# Patient Record
Sex: Male | Born: 1985 | Race: Black or African American | Hispanic: No | Marital: Married | Smoking: Never smoker
Health system: Southern US, Community
[De-identification: ages and names within clinical notes are randomized; demographics above are authoritative.]

## PROBLEM LIST (undated history)

## (undated) DIAGNOSIS — J45909 Unspecified asthma, uncomplicated: Secondary | ICD-10-CM

---

## 2018-03-10 ENCOUNTER — Ambulatory Visit
Admission: RE | Admit: 2018-03-10 | Discharge: 2018-03-10 | Disposition: A | Discharge status: Home / Self Care | Payer: No Typology Code available for payment source | Source: Ambulatory Visit | Attending: Nurse Practitioner | Admitting: Nurse Practitioner

## 2018-03-10 ENCOUNTER — Other Ambulatory Visit: Payer: Self-pay | Admitting: Nurse Practitioner

## 2018-03-10 DIAGNOSIS — M25562 Pain in left knee: Secondary | ICD-10-CM

## 2018-03-10 DIAGNOSIS — M542 Cervicalgia: Secondary | ICD-10-CM

## 2018-03-10 DIAGNOSIS — M25512 Pain in left shoulder: Secondary | ICD-10-CM

## 2018-03-11 DIAGNOSIS — S46012A Strain of muscle(s) and tendon(s) of the rotator cuff of left shoulder, initial encounter: Secondary | ICD-10-CM | POA: Insufficient documentation

## 2019-01-20 ENCOUNTER — Emergency Department (HOSPITAL_COMMUNITY)
Admission: EM | Admit: 2019-01-20 | Discharge: 2019-01-20 | Disposition: A | Discharge status: Home / Self Care | Payer: 59 | Attending: Emergency Medicine | Admitting: Emergency Medicine

## 2019-01-20 ENCOUNTER — Other Ambulatory Visit: Payer: Self-pay

## 2019-01-20 ENCOUNTER — Encounter (HOSPITAL_COMMUNITY): Payer: Self-pay | Admitting: Emergency Medicine

## 2019-01-20 DIAGNOSIS — M7918 Myalgia, other site: Secondary | ICD-10-CM | POA: Diagnosis not present

## 2019-01-20 DIAGNOSIS — M542 Cervicalgia: Secondary | ICD-10-CM | POA: Insufficient documentation

## 2019-01-20 DIAGNOSIS — R519 Headache, unspecified: Secondary | ICD-10-CM

## 2019-01-20 DIAGNOSIS — R51 Headache: Secondary | ICD-10-CM | POA: Diagnosis present

## 2019-01-20 DIAGNOSIS — U071 COVID-19: Secondary | ICD-10-CM | POA: Diagnosis not present

## 2019-01-20 DIAGNOSIS — W57XXXA Bitten or stung by nonvenomous insect and other nonvenomous arthropods, initial encounter: Secondary | ICD-10-CM

## 2019-01-20 DIAGNOSIS — R509 Fever, unspecified: Secondary | ICD-10-CM | POA: Diagnosis not present

## 2019-01-20 HISTORY — DX: Unspecified asthma, uncomplicated: J45.909

## 2019-01-20 MED ORDER — DIPHENHYDRAMINE HCL 25 MG PO CAPS
25.0000 mg | ORAL_CAPSULE | Freq: Once | ORAL | Status: AC
  Administered 2019-01-20: 25 mg via ORAL
  Filled 2019-01-20: qty 1

## 2019-01-20 MED ORDER — ACETAMINOPHEN 500 MG PO TABS
1000.0000 mg | ORAL_TABLET | Freq: Once | ORAL | Status: AC
  Administered 2019-01-20: 09:00:00 1000 mg via ORAL
  Filled 2019-01-20: qty 2

## 2019-01-20 MED ORDER — PROCHLORPERAZINE MALEATE 10 MG PO TABS: 10.0000 mg | ORAL_TABLET | Freq: Two times a day (BID) | ORAL | 0 refills | Status: AC | PRN

## 2019-01-20 MED ORDER — DOXYCYCLINE HYCLATE 100 MG PO CAPS: 100.0000 mg | ORAL_CAPSULE | Freq: Two times a day (BID) | ORAL | 0 refills | Status: AC

## 2019-01-20 MED ORDER — PROCHLORPERAZINE MALEATE 5 MG PO TABS
10.0000 mg | ORAL_TABLET | Freq: Once | ORAL | Status: AC
  Administered 2019-01-20: 10 mg via ORAL
  Filled 2019-01-20: qty 2

## 2019-01-20 NOTE — Discharge Instructions (Addendum)
Please see the information and instructions below regarding your visit.  Your diagnoses today include:  1. Frontal headache   2. Tick bite, initial encounter     You were seen and treated in the emergency department today for headache. Fortunately, your vitals, exam, and work-up is reassuring with no apparent emergent cause for your headache at this time.  Tests performed today include: See side panel of your discharge paperwork for testing performed today. Vital signs are listed at the bottom of these instructions.   Your COVID-19 test will be available in 24 to 48 hours.  It is available in my chart and if it is positive you will receive a call.  Medications prescribed:    Try to avoid daily or regular use of tylenol, aspirin, ibuprofen, and other overt-the-counter pain medications as this can contribute to rebound headaches.   Take any prescribed medications only as prescribed, and any over the counter medications only as directed on the packaging.  Doxycycline is an antibiotic that fights infection of tick origin. This medication can make your skin sensitive to the sun, so please ensure that you wear sunscreen, hats, or other coverage over your skin while taking this. This medicine CANNOT be taken by women while pregnant, breastfeeding, or trying to become pregnant.  Please speak with a healthcare provider if any of these situations apply to you.  You prescribed Compazine.  This is a nausea medication.  It works well for headache.  Please take it with Benadryl, 25 mg capsule to reduce side effects.  It may be taken up to twice a day.  You are prescribed ibuprofen, a non-steroidal anti-inflammatory agent (NSAID) for pain. You may take 600 mg every 6 hours as needed for pain. If still requiring this medication around the clock for acute pain after 10 days, please see your primary healthcare provider.  Women who are pregnant, breastfeeding, or planning on becoming pregnant should not take  non-steroidal anti-inflammatories such as Advil and Aleve. Tylenol is a safe over the counter pain reliever in pregnant women.  You may combine this medication with Tylenol, 650 mg every 6 hours, so you are receiving something for pain every 3 hours.  This is not a long-term medication unless under the care and direction of your primary provider. Taking this medication long-term and not under the supervision of a healthcare provider could increase the risk of stomach ulcers, kidney problems, and cardiovascular problems such as high blood pressure.    Home care instructions:   Drink plenty of fluids at home. This will help with your headache. Be cautious with caffeine use, as this can cause your headache to rebound when the effects wear off. If you drink more than 2 cups of coffee/caffeinated tea, or caffeinated soda per day, I suggest you wean down that amount.  Please follow any educational materials contained in this packet.   Follow-up instructions: Please follow-up with your primary care provider as needed for further evaluation of your symptoms if they are not completely improved.   Return instructions:  Please return to the Emergency Department if you experience worsening symptoms. It is VERY important that you monitor your symptoms at home. If you develop worsening headache, new fever, new neck stiffness, rash, focal weakness or numbness, or any other new or concerning symptoms, please return to the ED immediately, as these may be signs that your headache has become a potentially serious and life-threatening condition.  Please return if you have any other emergent concerns.  Additional  Information: =  Your vital signs today were: BP (!) 143/86 (BP Location: Right Arm)    Pulse 87    Temp 99.7 F (37.6 C) (Oral)    Resp 18    Ht 6\' 1"  (1.854 m)    Wt 90.7 kg    SpO2 100%    BMI 26.39 kg/m  If your blood pressure (BP) was elevated on multiple readings during this visit above 130 for  the top number or above 80 for the bottom number, please have this repeated by your primary care provider within one month. --------------  Thank you for allowing us to participate in your care today.

## 2019-01-20 NOTE — ED Provider Notes (Signed)
[name] Provider Note   CSN: [id number] Arrival date & time: 01/20/19  0805     History   Chief Complaint Chief Complaint  Patient presents with  . Tick Bite    HPI Mario LeveeWillie Karl Payer Jr. is a 33 y.o. male.     HPI   Patient is a 33 year old male with a past medical history of asthma presenting for headache, myalgias, fever and concern about a tick bite.  He reports that he began having a frontal headache that gradually evolved over the course of a day proximately 6 days ago.  He also developed some neck stiffness without rigidity.  He had some generalized myalgias but no arthralgias or joint effusions.  He denies any rash.  He denies any visual disturbance, numbness or weakness, speech disturbance, dizziness, lightheadedness or somnolence.  Patient reports that 5 days ago he developed a fever of T-max 100.5.  This has since resolved.  He notes that a couple days before the onset of his symptoms he brushed a tick off of his skin.  It did not in bed.  He searched his skin for other signs of tick bite and did not find any.  Patient denies any history of immune compromise status.  He has been taking ibuprofen intermittently for symptoms.  Patient reports that he was tested 2 days ago for COVID however the results will not be back until next week.  Past Medical History:  Diagnosis Date  . Asthma     There are no active problems to display for this patient.   History reviewed. No pertinent surgical history.      Home Medications    Prior to Admission medications   Not on File    Family History History reviewed. No pertinent family history.  Social History Social History   Tobacco Use  . Smoking status: Never Smoker  . Smokeless tobacco: Never Used  Substance Use Topics  . Alcohol use: Not on file  . Drug use: Not on file     Allergies   Patient has no known allergies.   Review of Systems Review of Systems   Constitutional: Positive for chills and fever.  HENT: Negative for congestion, sore throat, trouble swallowing and voice change.   Respiratory: Negative for cough, chest tightness and shortness of breath.   Gastrointestinal: Negative for abdominal pain, nausea and vomiting.  Musculoskeletal: Positive for myalgias and neck pain. Negative for arthralgias, joint swelling and neck stiffness.  Neurological: Positive for headaches.     Physical Exam Updated Vital Signs BP (!) 143/86 (BP Location: Right Arm)   Pulse 87   Temp 99.7 F (37.6 C) (Oral)   Resp 18   Ht 6\' 1"  (1.854 m)   Wt 90.7 kg   SpO2 100%   BMI 26.39 kg/m   Physical Exam Vitals signs and nursing note reviewed.  Constitutional:      General: He is not in acute distress.    Appearance: He is well-developed.  HENT:     Head: Normocephalic and atraumatic.  Eyes:     Conjunctiva/sclera: Conjunctivae normal.     Pupils: Pupils are equal, round, and reactive to light.  Neck:     Musculoskeletal: Normal range of motion and neck supple. No neck rigidity.     Comments: Negative Brudzinski sign.  No meningismus. Cardiovascular:     Rate and Rhythm: Normal rate and regular rhythm.     Heart sounds: S1 normal and S2 normal. No  murmur.  Pulmonary:     Effort: Pulmonary effort is normal.     Breath sounds: Normal breath sounds. No wheezing or rales.  Abdominal:     General: There is no distension.     Palpations: Abdomen is soft.     Tenderness: There is no abdominal tenderness. There is no guarding.  Musculoskeletal: Normal range of motion.        General: No deformity.  Lymphadenopathy:     Cervical: No cervical adenopathy.  Skin:    General: Skin is warm and dry.     Findings: No erythema or rash.  Neurological:     General: No focal deficit present.     Mental Status: He is alert and oriented to person, place, and time.     Comments: Mental Status:  Alert, oriented, thought content appropriate, able to give a  coherent history. Speech fluent without evidence of aphasia. Able to follow 2 step commands without difficulty.  Cranial Nerves:  II:  Peripheral visual fields grossly normal, pupils equal, round, reactive to light III,IV, VI: ptosis not present, extra-ocular motions intact bilaterally  V,VII: smile symmetric, facial light touch sensation equal VIII: hearing grossly normal to voice  X: uvula elevates symmetrically  XI: bilateral shoulder shrug symmetric and strong XII: midline tongue extension without fassiculations Motor:  Normal tone. 5/5 in upper and lower extremities bilaterally including strong and equal grip strength and dorsiflexion/plantar flexion Strength 5 out of 5 in upper and lower extremities. Patient moves extremities symmetrically and with good coordination. Normal and symmetric gait.  Psychiatric:        Behavior: Behavior normal.        Thought Content: Thought content normal.        Judgment: Judgment normal.      ED Treatments / Results  Labs (all labs ordered are listed, but only abnormal results are displayed) Labs Reviewed  NOVEL CORONAVIRUS, NAA (HOSPITAL ORDER, SEND-OUT TO REF LAB)    EKG    Radiology No results found.  Procedures Procedures (including critical care time)  Medications Ordered in ED Medications  acetaminophen (TYLENOL) tablet 1,000 mg (1,000 mg Oral Given 01/20/19 0913)  prochlorperazine (COMPAZINE) tablet 10 mg (10 mg Oral Given 01/20/19 0913)  diphenhydrAMINE (BENADRYL) capsule 25 mg (25 mg Oral Given 01/20/19 0914)     Initial Impression / Assessment and Plan / ED Course  I have reviewed the triage vital signs and the nursing notes.  Pertinent labs & imaging results that were available during my care of the patient were reviewed by me and considered in my medical decision making (see chart for details).        This is a well-appearing 33 year old male presenting for headache for myalgias, low-grade fever, now resolved, and  possible tick bite.  Differential diagnosis includes COVID-19, other viral illness, viral meningitis, [place] spotted fever.  Patient is very well-appearing and has normal vital signs here in the emergency department.  He is neurologically intact.  He appears to be improving in his symptoms.  Given lack of meningismus, normal vital signs and well appearance, do not feel that lumbar puncture is indicated.  Engaged in shared decision-making with the patient regarding proceeding with empiric treatment for possible early [facility] given the possible tick bite and concern that there could have been other tics.  This was discussed with Dr. Melene Planan Floyd who is in agreement with plan of care.  Patient elects to be retested for COVID-19 and take doxycycline.  Risks and benefits of treatment were discussed with the patient.  He is given return precautions for any worsening headache, neck stiffness, rash, return of fevers, dizziness, lightheadedness, speech disturbance or somnolence.  Patient is in understanding and agrees with plan of care.  Final Clinical Impressions(s) / ED Diagnoses   Final diagnoses:  Frontal headache  Tick bite, initial encounter    ED Discharge Orders         Ordered    doxycycline (VIBRAMYCIN) 100 MG capsule  2 times daily     01/20/19 1012    prochlorperazine (COMPAZINE) 10 MG tablet  2 times daily PRN     01/20/19 1012           Albesa Seen, PA-C 01/20/19 [place], [place], DO 01/20/19 1401

## 2019-01-20 NOTE — ED Notes (Signed)
Patient verbalizes understanding of discharge instructions. Opportunity for questioning and answers were provided. Armband removed by staff, pt discharged from ED.  

## 2019-01-20 NOTE — ED Triage Notes (Signed)
Pt arrives to ED from home with complaints of a tick bite over two weeks ago. Pt now complains about a sniff neck and headache for the last week.

## 2019-01-21 LAB — NOVEL CORONAVIRUS, NAA (HOSP ORDER, SEND-OUT TO REF LAB; TAT 18-24 HRS): SARS-CoV-2, NAA: DETECTED — AB
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
## 2019-05-16 ENCOUNTER — Other Ambulatory Visit: Payer: Self-pay

## 2019-05-16 DIAGNOSIS — Z20822 Contact with and (suspected) exposure to covid-19: Secondary | ICD-10-CM

## 2019-05-18 LAB — NOVEL CORONAVIRUS, NAA: SARS-CoV-2, NAA: DETECTED — AB

## 2019-11-01 ENCOUNTER — Ambulatory Visit: Payer: 59 | Admitting: Podiatry

## 2019-11-01 ENCOUNTER — Other Ambulatory Visit: Payer: Self-pay

## 2019-11-01 DIAGNOSIS — L603 Nail dystrophy: Secondary | ICD-10-CM

## 2019-11-01 DIAGNOSIS — B351 Tinea unguium: Secondary | ICD-10-CM | POA: Diagnosis not present

## 2019-11-01 MED ORDER — CICLOPIROX 8 % EX SOLN: Freq: Every day | CUTANEOUS | 0 refills | Status: AC

## 2019-11-01 NOTE — Addendum Note (Signed)
Addended by: Parthenia Ames on: 11/01/2019 12:05 PM   Modules accepted: Orders

## 2019-11-01 NOTE — Progress Notes (Signed)
  Subjective:  Patient ID: Mario Levee., male    DOB: 04-28-1986,  MRN: [id number]  Chief Complaint  Patient presents with  . Nail Problem    Bilateral 1st toenails discolored and painful. Right 2nd toenail medial border painful, 1-2 year duration painful nails.  . Nail Problem    Bilateral 1st toenails discolored since childhood.    34 y.o. male presents with the above complaint. History confirmed with patient.   Objective:  Physical Exam: warm, good capillary refill, nail exam hallux bilat, 2nd toe left, 5th toe left thickened, black discoloration, no trophic changes or ulcerative lesions, normal DP and PT pulses and normal sensory exam. Left Foot: normal exam, no swelling, tenderness, instability; ligaments intact, full range of motion of all ankle/foot joints  Right Foot: normal exam, no swelling, tenderness, instability; ligaments intact, full range of motion of all ankle/foot joints   Assessment:   1. Onychomycosis   2. Nail dystrophy      Plan:  Patient was evaluated and treated and all questions answered.  Onychomycosis -Educated on etiology of nail fungus. -Nail sample taken for microbiology and histology. -Rx Penlac -Consider switch to oral medication at next visit  Return in about 6 weeks (around 12/13/2019) for Nail Fungus.

## 2020-06-15 ENCOUNTER — Other Ambulatory Visit: Payer: Self-pay

## 2020-06-15 ENCOUNTER — Ambulatory Visit: Payer: 59 | Admitting: Podiatry

## 2020-06-15 DIAGNOSIS — Z79899 Other long term (current) drug therapy: Secondary | ICD-10-CM

## 2020-06-15 DIAGNOSIS — B351 Tinea unguium: Secondary | ICD-10-CM

## 2020-06-15 DIAGNOSIS — L603 Nail dystrophy: Secondary | ICD-10-CM

## 2020-06-15 MED ORDER — TERBINAFINE HCL 250 MG PO TABS: 250.0000 mg | ORAL_TABLET | Freq: Every day | ORAL | 2 refills | Status: AC

## 2020-06-19 LAB — HEPATIC FUNCTION PANEL
AG Ratio: 1.6 (calc) (ref 1.0–2.5)
ALT: 16 U/L (ref 9–46)
AST: 18 U/L (ref 10–40)
Albumin: 4.9 g/dL (ref 3.6–5.1)
Alkaline phosphatase (APISO): 66 U/L (ref 36–130)
Bilirubin, Direct: 0.1 mg/dL (ref 0.0–0.2)
Globulin: 3 g/dL (calc) (ref 1.9–3.7)
Indirect Bilirubin: 0.2 mg/dL (calc) (ref 0.2–1.2)
Total Bilirubin: 0.3 mg/dL (ref 0.2–1.2)
Total Protein: 7.9 g/dL (ref 6.1–8.1)

## 2020-06-27 NOTE — Progress Notes (Signed)
  Subjective:  Patient ID: Mario Cohen., male    DOB: 1986/01/29,  MRN: [id number]  Chief Complaint  Patient presents with  . Nail Problem    Culture results    34 y.o. male presents with the above complaint. History confirmed with patient.   Objective:  Physical Exam: warm, good capillary refill, nail exam hallux bilat, 2nd toe left, 5th toe left thickened, black discoloration, no trophic changes or ulcerative lesions, normal DP and PT pulses and normal sensory exam. Left Foot: normal exam, no swelling, tenderness, instability; ligaments intact, full range of motion of all ankle/foot joints  Right Foot: normal exam, no swelling, tenderness, instability; ligaments intact, full range of motion of all ankle/foot joints   Assessment:   1. Onychomycosis   2. Long-term use of high-risk medication   3. Nail dystrophy      Plan:  Patient was evaluated and treated and all questions answered.  Onychomycosis -Reviewed results. C/w onychomycosis -Rx terbinafine, educated on r/b/a. Order LFTs. Will repeat in 1 month.  Return in about 5 weeks (around 07/20/2020) for Nail Fungus.

## 2020-07-31 ENCOUNTER — Ambulatory Visit: Payer: 59 | Admitting: Podiatry

## 2020-08-07 ENCOUNTER — Other Ambulatory Visit: Payer: Self-pay

## 2020-08-07 ENCOUNTER — Ambulatory Visit (INDEPENDENT_AMBULATORY_CARE_PROVIDER_SITE_OTHER): Payer: 59 | Admitting: Podiatry

## 2020-08-07 DIAGNOSIS — L603 Nail dystrophy: Secondary | ICD-10-CM

## 2020-08-07 DIAGNOSIS — B351 Tinea unguium: Secondary | ICD-10-CM

## 2020-08-07 DIAGNOSIS — Z79899 Other long term (current) drug therapy: Secondary | ICD-10-CM

## 2020-08-07 LAB — HEPATIC FUNCTION PANEL
AG Ratio: 1.5 (calc) (ref 1.0–2.5)
ALT: 17 U/L (ref 9–46)
AST: 18 U/L (ref 10–40)
Albumin: 4.5 g/dL (ref 3.6–5.1)
Alkaline phosphatase (APISO): 62 U/L (ref 36–130)
Bilirubin, Direct: 0.1 mg/dL (ref 0.0–0.2)
Globulin: 3.1 g/dL (calc) (ref 1.9–3.7)
Indirect Bilirubin: 0.2 mg/dL (calc) (ref 0.2–1.2)
Total Bilirubin: 0.3 mg/dL (ref 0.2–1.2)
Total Protein: 7.6 g/dL (ref 6.1–8.1)

## 2020-08-07 NOTE — Progress Notes (Signed)
  Subjective:  Patient ID: Mario Cohen., male    DOB: 1985-12-25,  MRN: [id number]  Chief Complaint  Patient presents with  . Nail Problem    Pt states no concerns with Lamisil, denies any side effects and states he is tolerating the medication well.     35 y.o. male presents with the above complaint. History confirmed with patient.   Objective:  Physical Exam: warm, good capillary refill, hallux and lesser Left Foot: normal exam, no swelling, tenderness, instability; ligaments intact, full range of motion of all ankle/foot joints  Right Foot: normal exam, no swelling, tenderness, instability; ligaments intact, full range of motion of all ankle/foot joints   Assessment:   1. Long-term use of high-risk medication   2. Onychomycosis   3. Nail dystrophy    Plan:  Patient was evaluated and treated and all questions answered.  Onychomycosis -Appears to be improving given clear growth. Repeat LFTs. Continue Penlac and Terbinafine. -F/u in 6 weeks  Return in about 6 weeks (around 09/18/2020).

## 2020-09-18 ENCOUNTER — Ambulatory Visit: Payer: 59 | Admitting: Podiatry

## 2020-09-25 ENCOUNTER — Encounter: Payer: Self-pay | Admitting: Podiatry

## 2020-09-25 ENCOUNTER — Ambulatory Visit: Payer: 59 | Admitting: Podiatry

## 2020-09-25 ENCOUNTER — Other Ambulatory Visit: Payer: Self-pay

## 2020-09-25 DIAGNOSIS — B351 Tinea unguium: Secondary | ICD-10-CM | POA: Diagnosis not present

## 2020-09-25 DIAGNOSIS — Z79899 Other long term (current) drug therapy: Secondary | ICD-10-CM

## 2020-09-25 DIAGNOSIS — L603 Nail dystrophy: Secondary | ICD-10-CM

## 2020-09-25 NOTE — Progress Notes (Signed)
°  Subjective:  Patient ID: Mario Cohen., male    DOB: [birth year],  MRN: [id number]  Chief Complaint  Patient presents with   Nail Problem    F/U nail fungus -pt states,' some improvement." - pt completed lamisil -no reaction -pt states he did not pick up penlac from pharmacy    35 y.o. male presents with the above complaint. History confirmed with patient.   Objective:  Physical Exam: warm, good capillary refill, hallux and lesser Left Foot: normal exam, no swelling, tenderness, instability; ligaments intact, full range of motion of all ankle/foot joints/ Nails with proximal clearing noted  Assessment:   1. Long-term use of high-risk medication   2. Onychomycosis   3. Nail dystrophy    Plan:  Patient was evaluated and treated and all questions answered.  Onychomycosis -At this point he does have proximal growth, only continue with penlac. No need for further oral therapy. F/u should issues persist.  No follow-ups on file.

## 2021-01-21 ENCOUNTER — Other Ambulatory Visit: Payer: Self-pay | Admitting: Nurse Practitioner

## 2021-01-21 ENCOUNTER — Ambulatory Visit
Admission: RE | Admit: 2021-01-21 | Discharge: 2021-01-21 | Disposition: A | Discharge status: Home / Self Care | Payer: Self-pay | Source: Ambulatory Visit | Attending: Nurse Practitioner | Admitting: Nurse Practitioner

## 2021-01-21 ENCOUNTER — Other Ambulatory Visit: Payer: Self-pay

## 2021-01-21 DIAGNOSIS — R52 Pain, unspecified: Secondary | ICD-10-CM

## 2023-05-05 ENCOUNTER — Other Ambulatory Visit: Payer: Self-pay | Admitting: Urology

## 2023-05-06 ENCOUNTER — Telehealth: Payer: Self-pay

## 2023-05-06 LAB — PSA: Prostate Specific Ag, Serum: 0.5 ng/mL (ref 0.0–4.0)

## 2023-05-06 NOTE — Telephone Encounter (Signed)
Patient informed normal PSA results, 0.5. Discussed normal range per Dr. Laverle Patter is less than 1.0 for males less than 36 years old.

## 2023-07-27 ENCOUNTER — Telehealth: Payer: 59 | Admitting: Family Medicine

## 2023-07-27 DIAGNOSIS — R519 Headache, unspecified: Secondary | ICD-10-CM

## 2023-07-27 DIAGNOSIS — Z8616 Personal history of COVID-19: Secondary | ICD-10-CM

## 2023-07-27 NOTE — Progress Notes (Signed)
Virtual Visit Consent   Mario Billy., you are scheduled for a virtual visit with a [facility] provider today. Just as with appointments in the office, your consent must be obtained to participate. Your consent will be active for this visit and any virtual visit you may have with one of our providers in the next 365 days. If you have a MyChart account, a copy of this consent can be sent to you electronically.  As this is a virtual visit, video technology does not allow for your provider to perform a traditional examination. This may limit your provider's ability to fully assess your condition. If your provider identifies any concerns that need to be evaluated in person or the need to arrange testing (such as labs, EKG, etc.), we will make arrangements to do so. Although advances in technology are sophisticated, we cannot ensure that it will always work on either your end or our end. If the connection with a video visit is poor, the visit may have to be switched to a telephone visit. With either a video or telephone visit, we are not always able to ensure that we have a secure connection.  By engaging in this virtual visit, you consent to the provision of healthcare and authorize for your insurance to be billed (if applicable) for the services provided during this visit. Depending on your insurance coverage, you may receive a charge related to this service.  I need to obtain your verbal consent now. Are you willing to proceed with your visit today? Mario Palm. has provided verbal consent on 07/27/2023 for a virtual visit (video or telephone). Georgana Curio, FNP  Date: 07/27/2023 5:21 PM  Virtual Visit via Video Note   I, Georgana Curio, connected with  Mario Cohen.  ([id number], 03/26/1986) on 07/27/23 at  5:15 PM EST by a video-enabled telemedicine application and verified that I am speaking with the correct person using two identifiers.  Location: Patient: Virtual  Visit Location Patient: Home Provider: Virtual Visit Location Provider: Home Office   I discussed the limitations of evaluation and management by telemedicine and the availability of in person appointments. The patient expressed understanding and agreed to proceed.    History of Present Illness: Mario Seachrist. is a 37 y.o. who identifies as a male who was assigned male at birth, and is being seen today for headache. He says he feel like he did when he had covid last time but hasn't tested yet. He requests a work note due to headache. In no distress. Marland Kitchen  HPI: HPI  Problems:  Patient Active Problem List   Diagnosis Date Noted   Strain of muscle(s) and tendon(s) of the rotator cuff of left shoulder, initial encounter 03/11/2018    Allergies: No Known Allergies Medications:  Current Outpatient Medications:    ciclopirox (PENLAC) 8 % solution, Apply topically at bedtime. Apply over nail and surrounding skin. Apply daily over previous coat. Remove weekly with file or polish remover., Disp: 6.6 mL, Rfl: 0   prochlorperazine (COMPAZINE) 10 MG tablet, Take 1 tablet (10 mg total) by mouth 2 (two) times daily as needed for nausea or vomiting., Disp: 10 tablet, Rfl: 0   terbinafine (LAMISIL) 250 MG tablet, Take 1 tablet (250 mg total) by mouth daily., Disp: 30 tablet, Rfl: 2  Observations/Objective: Patient is well-developed, well-nourished in no acute distress.  Resting comfortably  at home.  Head is normocephalic, atraumatic.  No labored breathing.  Speech is clear and  coherent with logical content.  Patient is alert and oriented at baseline.   Assessment and Plan: 1. Acute nonintractable headache, unspecified headache type (Primary)  Increase fluids, do covid test, ibuprofen or tylenol, Vit C, D and zinc. UC as needed.   Follow Up Instructions: I discussed the assessment and treatment plan with the patient. The patient was provided an opportunity to ask questions and all were  answered. The patient agreed with the plan and demonstrated an understanding of the instructions.  A copy of instructions were sent to the patient via MyChart unless otherwise noted below.     The patient was advised to call back or seek an in-person evaluation if the symptoms worsen or if the condition fails to improve as anticipated.    Georgana Curio, FNP

## 2023-07-27 NOTE — Patient Instructions (Signed)
General Headache Without Cause A headache is pain or discomfort felt around the head or neck area. There are many causes and types of headaches. A few common types include: Tension headaches. Migraine headaches. Cluster headaches. Chronic daily headaches. Sometimes, the specific cause of a headache may not be found. Follow these instructions at home: Watch your condition for any changes. Let your health care provider know about them. Take these steps to help with your condition: Managing pain     Take over-the-counter and prescription medicines only as told by your health care provider. Treatment may include medicines for pain that are taken by mouth or applied to the skin. Lie down in a dark, quiet room when you have a headache. Keep lights dim if bright lights bother you or make your headaches worse. If directed, put ice on your head and neck area: Put ice in a plastic bag. Place a towel between your skin and the bag. Leave the ice on for 20 minutes, 2-3 times per day. Remove the ice if your skin turns bright red. This is very important. If you cannot feel pain, heat, or cold, you have a greater risk of damage to the area. If directed, apply heat to the affected area. Use the heat source that your health care provider recommends, such as a moist heat pack or a heating pad. Place a towel between your skin and the heat source. Leave the heat on for 20-30 minutes. Remove the heat if your skin turns bright red. This is especially important if you are unable to feel pain, heat, or cold. You have a greater risk of getting burned. Eating and drinking Eat meals on a regular schedule. If you drink alcohol: Limit how much you have to: 0-1 drink a day for women who are not pregnant. 0-2 drinks a day for men. Know how much alcohol is in a drink. In the [place] one drink equals one 12 oz bottle of beer (355 mL), one 5 oz glass of wine (148 mL), or one 1 oz glass of hard liquor (44 mL). Stop  drinking caffeine, or decrease the amount of caffeine you drink. Drink enough fluid to keep your urine pale yellow. General instructions  Keep a headache journal to help find out what may trigger your headaches. For example, write down: What you eat and drink. How much sleep you get. Any change to your diet or medicines. Try massage or other relaxation techniques. Limit stress. Sit up straight, and do not tense your muscles. Do not use any products that contain nicotine or tobacco. These products include cigarettes, chewing tobacco, and vaping devices, such as e-cigarettes. If you need help quitting, ask your health care provider. Exercise regularly as told by your health care provider. Sleep on a regular schedule. Get 7-9 hours of sleep each night, or the amount recommended by your health care provider. Keep all follow-up visits. This is important. Contact a health care provider if: Medicine does not help your symptoms. You have a headache that is different from your usual headache. You have nausea or you vomit. You have a fever. Get help right away if: Your headache: Becomes severe quickly. Gets worse after moderate to intense physical activity. You have any of these symptoms: Repeated vomiting. Pain or stiffness in your neck. Changes to your vision. Pain in an eye or ear. Problems with speech. Muscular weakness or loss of muscle control. Loss of balance or coordination. You feel faint or pass out. You have confusion. You have  a seizure. These symptoms may represent a serious problem that is an emergency. Do not wait to see if the symptoms will go away. Get medical help right away. Call your local emergency services (911 in the [place] Do not drive yourself to the hospital. Summary A headache is pain or discomfort felt around the head or neck area. There are many causes and types of headaches. In some cases, the cause may not be found. Keep a headache journal to help find out  what may trigger your headaches. Watch your condition for any changes. Let your health care provider know about them. Contact a health care provider if you have a headache that is different from the usual headache, or if your symptoms are not helped by medicine. Get help right away if your headache becomes severe, you vomit, you have a loss of vision, you lose your balance, or you have a seizure. This information is not intended to replace advice given to you by your health care provider. Make sure you discuss any questions you have with your health care provider. Document Revised: 12/12/2020 Document Reviewed: 12/12/2020 Elsevier Patient Education  2024 [employer].

## 2024-02-13 ENCOUNTER — Ambulatory Visit
Admission: EM | Admit: 2024-02-13 | Discharge: 2024-02-13 | Disposition: A | Discharge status: Home / Self Care | Attending: Family Medicine | Admitting: Family Medicine

## 2024-02-13 DIAGNOSIS — R350 Frequency of micturition: Secondary | ICD-10-CM | POA: Insufficient documentation

## 2024-02-13 LAB — POCT URINALYSIS DIP (MANUAL ENTRY)
Bilirubin, UA: NEGATIVE
Blood, UA: NEGATIVE
Glucose, UA: NEGATIVE mg/dL
Ketones, POC UA: NEGATIVE mg/dL
Leukocytes, UA: NEGATIVE
Nitrite, UA: NEGATIVE
Protein Ur, POC: NEGATIVE mg/dL
Spec Grav, UA: 1.025 (ref 1.010–1.025)
Urobilinogen, UA: 0.2 U/dL
pH, UA: 6.5 (ref 5.0–8.0)

## 2024-02-13 NOTE — ED Provider Notes (Signed)
 [facility]  Note:  This document was prepared using Conservation officer, historic buildings and may include unintentional dictation errors.  MRN: [id number] DOB: 03-16-1986  Subjective:   Cylis Ayars. is a 38 y.o. male presenting for 2 week history of urinary frequency, malodorous urine. Denies dysuria, hematuria, penile discharge, penile swelling, testicular pain, testicular swelling, anal pain, groin pain, paraesthesia.  No history of kidney stones. No history of diabetes. Works outside, tries to stay hydrated but feels thirsty.  Reports that he felt like he was not drinking enough water and started to do a lot of fruit juices with some water.  His concern is that his wife was just treated for UTI.  No concern for sexually transmitted infection however.  No current facility-administered medications for this encounter.  Current Outpatient Medications:    ciclopirox  (PENLAC ) 8 % solution, Apply topically at bedtime. Apply over nail and surrounding skin. Apply daily over previous coat. Remove weekly with file or polish remover., Disp: 6.6 mL, Rfl: 0   prochlorperazine  (COMPAZINE ) 10 MG tablet, Take 1 tablet (10 mg total) by mouth 2 (two) times daily as needed for nausea or vomiting., Disp: 10 tablet, Rfl: 0   terbinafine  (LAMISIL ) 250 MG tablet, Take 1 tablet (250 mg total) by mouth daily., Disp: 30 tablet, Rfl: 2   No Known Allergies  Past Medical History:  Diagnosis Date   Asthma      History reviewed. No pertinent surgical history.  History reviewed. No pertinent family history.  Social History   Tobacco Use   Smoking status: Never   Smokeless tobacco: Never    ROS   Objective:   Vitals: BP 130/78 (BP Location: Right Arm)   Pulse 76   Temp 97.9 F (36.6 C) (Oral)   Resp 16   SpO2 96%   Physical Exam Constitutional:      General: He is not in acute distress.    Appearance: Normal appearance. He is well-developed and normal weight. He  is not ill-appearing, toxic-appearing or diaphoretic.  HENT:     Head: Normocephalic and atraumatic.     Right Ear: External ear normal.     Left Ear: External ear normal.     Nose: Nose normal.     Mouth/Throat:     Pharynx: Oropharynx is clear.  Eyes:     General: No scleral icterus.       Right eye: No discharge.        Left eye: No discharge.     Extraocular Movements: Extraocular movements intact.  Cardiovascular:     Rate and Rhythm: Normal rate.  Pulmonary:     Effort: Pulmonary effort is normal.  Musculoskeletal:     Cervical back: Normal range of motion.  Skin:    General: Skin is warm and dry.  Neurological:     Mental Status: He is alert and oriented to person, place, and time.  Psychiatric:        Mood and Affect: Mood normal.        Behavior: Behavior normal.        Thought Content: Thought content normal.        Judgment: Judgment normal.    Results for orders placed or performed during the hospital encounter of 02/13/24 (from the past 24 hours)  POCT urinalysis dipstick     Status: None   Collection Time: 02/13/24  2:32 PM  Result Value Ref Range   Color, UA yellow yellow  Clarity, UA clear clear   Glucose, UA negative negative mg/dL   Bilirubin, UA negative negative   Ketones, POC UA negative negative mg/dL   Spec Grav, UA 8.974 8.989 - 1.025   Blood, UA negative negative   pH, UA 6.5 5.0 - 8.0   Protein Ur, POC negative negative mg/dL   Urobilinogen, UA 0.2 0.2 or 1.0 E.U./dL   Nitrite, UA Negative Negative   Leukocytes, UA Negative Negative    Assessment and Plan :   PDMP not reviewed this encounter.  1. Urinary frequency    Patient declined testing for sexually transmitted infection as he is in a monogamous relationship with his wife.  Urine culture pending.  Recommended hydrating much more consistently, avoid urinary irritants.  Counseled patient on potential for adverse effects with medications prescribed/recommended today, ER and  return-to-clinic precautions discussed, patient verbalized understanding.    Christopher Savannah, [place] 02/13/24 1437

## 2024-02-13 NOTE — ED Triage Notes (Signed)
 Pt states frequent urination and foul smelling urine for the past two weeks.

## 2024-02-15 ENCOUNTER — Ambulatory Visit (HOSPITAL_COMMUNITY): Payer: Self-pay

## 2024-02-15 LAB — URINE CULTURE: Culture: NO GROWTH
